# Patient Record
Sex: Female | Born: 1990 | Race: White | Hispanic: No | Marital: Single | Smoking: Current every day smoker
Health system: Southern US, Community
[De-identification: ages and names within clinical notes are randomized; demographics above are authoritative.]

## PROBLEM LIST (undated history)

## (undated) DIAGNOSIS — Z8619 Personal history of other infectious and parasitic diseases: Secondary | ICD-10-CM

## (undated) DIAGNOSIS — O24419 Gestational diabetes mellitus in pregnancy, unspecified control: Secondary | ICD-10-CM

## (undated) DIAGNOSIS — L732 Hidradenitis suppurativa: Secondary | ICD-10-CM

## (undated) HISTORY — DX: Hidradenitis suppurativa: L73.2

## (undated) HISTORY — DX: Personal history of other infectious and parasitic diseases: Z86.19

## (undated) HISTORY — PX: TUBAL LIGATION: SHX77

## (undated) HISTORY — DX: Gestational diabetes mellitus in pregnancy, unspecified control: O24.419

---

## 2009-11-09 ENCOUNTER — Other Ambulatory Visit: Admission: RE | Admit: 2009-11-09 | Discharge: 2009-11-09 | Payer: Self-pay | Admitting: Obstetrics and Gynecology

## 2010-03-21 ENCOUNTER — Encounter
Admission: RE | Admit: 2010-03-21 | Discharge: 2010-05-01 | Payer: Self-pay | Source: Home / Self Care | Attending: Obstetrics & Gynecology | Admitting: Obstetrics & Gynecology

## 2010-05-25 ENCOUNTER — Inpatient Hospital Stay (HOSPITAL_COMMUNITY)
Admission: AD | Admit: 2010-05-25 | Discharge: 2010-05-27 | DRG: 775 | Disposition: A | Discharge status: Home Health | Payer: Medicaid Other | Source: Ambulatory Visit | Attending: Obstetrics & Gynecology | Admitting: Obstetrics & Gynecology

## 2010-05-25 DIAGNOSIS — O99814 Abnormal glucose complicating childbirth: Secondary | ICD-10-CM

## 2010-05-25 LAB — RPR: RPR Ser Ql: NONREACTIVE

## 2010-05-25 LAB — CBC
MCH: 26.5 pg (ref 26.0–34.0)
MCV: 82 fL (ref 78.0–100.0)
Platelets: 305 10*3/uL (ref 150–400)
RBC: 4.61 MIL/uL (ref 3.87–5.11)
RDW: 14.7 % (ref 11.5–15.5)
WBC: 16.1 10*3/uL — ABNORMAL HIGH (ref 4.0–10.5)

## 2010-05-26 LAB — CBC
HCT: 31.4 % — ABNORMAL LOW (ref 36.0–46.0)
Hemoglobin: 9.7 g/dL — ABNORMAL LOW (ref 12.0–15.0)
MCV: 83.3 fL (ref 78.0–100.0)
WBC: 22.7 10*3/uL — ABNORMAL HIGH (ref 4.0–10.5)

## 2010-05-26 LAB — GLUCOSE, CAPILLARY
Glucose-Capillary: 77 mg/dL (ref 70–99)
Glucose-Capillary: 160 mg/dL — ABNORMAL HIGH (ref 70–99)
Glucose-Capillary: 151 mg/dL — ABNORMAL HIGH (ref 70–99)

## 2010-05-27 LAB — GLUCOSE, CAPILLARY
Glucose-Capillary: 80 mg/dL (ref 70–99)
Glucose-Capillary: 105 mg/dL — ABNORMAL HIGH (ref 70–99)

## 2010-06-08 NOTE — H&P (Signed)
  [name], [name] NO.:  [id number]  MEDICAL RECORD NO.:  [id number]           PATIENT TYPE:  LOCATION:                                 FACILITY:  PHYSICIAN:  Tilda Burrow, M.D. DATE OF BIRTH:  [birth year]  DATE OF ADMISSION: DATE OF DISCHARGE:                             HISTORY & PHYSICAL   DATE OF ADMISSION:  May 26, 2010, at 7:30 p.m. to [facility].  ADMISSION DIAGNOSIS:  Pregnancy, 39 weeks' gestation; class A2 diabetes mellitus on glyburide, metformin, and Lantus.  HISTORY OF PRESENT ILLNESS:  This 20 year old primigravida with unsure LMP and ultrasound-assigned EDC of June 03, 2010, based on 8-week 4-day ultrasound on October 26, 2009, with 19-week ultrasound on January 05, 2011, suggesting May 30, 2010, and 28-week ultrasound on March 07, 2010, suggesting [facility] of May 20, 2010.  This progression of the EDC based on later ultrasound suggests larger than average infant, and estimated fetal weight by ultrasound on May 11, 2010, was 3422 g (7 pounds 9 ounces) with an AFI of 22.54.  The abdominal circumference on the baby was in the 98th percentile at the time of that ultrasound.  The patient is admitted at 39 weeks for induction of labor.  Non-stress tests are being performed  biweekly at [facility] and will be continued until day of induction.  Prenatal course has been notable for abnormal 2-hour glucose tolerance test at 28 weeks' gestation with progression from diet control; with hemoglobin A1c of 6.2 on March 07, 2010, to addition of glyburide, then metformin and Lantus with progression of Lantus from 38 __________ to most recently the doses are as follows: 1. Glyburide 5 mg b.i.d. 2. Metformin 500 mg b.i.d. 3. Lantus 40 mg at bedtime.  Blood sugars over the last week from February 10 to May 18, 2010, have been excellent.  PAST MEDICAL HISTORY:  Positive for hidradenitis of mild difficulty during the  pregnancy.  SURGICAL HISTORY:  Negative.  ALLERGIES:  None.  SOCIAL HISTORY:  She is a smoker, but has reduced frequency.  Is a nondrinker and denies recreational drugs.  Prenatal labs include blood type A+, rubella immunity present, hemoglobin 12, hematocrit 37; hepatitis, HIV, RPR, GC and Chlamydia all negative.  MSAFP was declined.  Initial chlamydia test was positive and repeated with [facility] on December 07, 2009.  Hemoglobin A1c was 6.2 on December 7.  PHYSICAL EXAMINATION:  GENERAL:  A large-framed Caucasian female. VITAL SIGNS:  Weight 251.2, blood pressure 145/80. HEENT:  With pupils equal, round, and reactive. NECK:  Supple. CARDIOVASCULAR:  Unremarkable. ABDOMEN:  Fundal height 44-45 cm, unchanged since 3 visits.  Fetus is vertex presentation.  Non-stress is reactive.  PLAN:  Continue biweekly NSTs with induction of labor on May 26, 2010, 7:30 p.m.     Tilda Burrow, M.D.     JVF/MEDQ  D:  05/18/2010  T:  05/19/2010  Job:  [id number]  cc:   [facility]  Electronically Signed by Christin Bach M.D. on 06/08/2010 10:41:46 AM

## 2010-12-16 ENCOUNTER — Emergency Department (HOSPITAL_COMMUNITY)
Admission: EM | Admit: 2010-12-16 | Discharge: 2010-12-16 | Disposition: A | Discharge status: Home / Self Care | Payer: Medicaid Other | Attending: Emergency Medicine | Admitting: Emergency Medicine

## 2010-12-16 ENCOUNTER — Encounter: Payer: Self-pay | Admitting: *Deleted

## 2010-12-16 DIAGNOSIS — H60399 Other infective otitis externa, unspecified ear: Secondary | ICD-10-CM | POA: Insufficient documentation

## 2010-12-16 DIAGNOSIS — F17200 Nicotine dependence, unspecified, uncomplicated: Secondary | ICD-10-CM | POA: Insufficient documentation

## 2010-12-16 DIAGNOSIS — E119 Type 2 diabetes mellitus without complications: Secondary | ICD-10-CM | POA: Insufficient documentation

## 2010-12-16 MED ORDER — NEOMYCIN-POLYMYXIN-HC 3.5-10000-1 OT SOLN
3.0000 [drp] | Freq: Once | OTIC | Status: AC
  Administered 2010-12-16: 3 [drp] via OTIC
  Filled 2010-12-16: qty 10

## 2010-12-16 MED ORDER — AMOXICILLIN 250 MG PO CAPS
500.0000 mg | ORAL_CAPSULE | Freq: Once | ORAL | Status: AC
  Administered 2010-12-16: 500 mg via ORAL
  Filled 2010-12-16: qty 2

## 2010-12-16 MED ORDER — ANTIPYRINE-BENZOCAINE 5.4-1.4 % OT SOLN
3.0000 [drp] | Freq: Once | OTIC | Status: AC
  Administered 2010-12-16: 3 [drp] via OTIC
  Filled 2010-12-16: qty 10

## 2010-12-16 NOTE — ED Notes (Signed)
Patient c/o left earache and right earache, left > right, hx of yeast infections in ear canal per pt.

## 2010-12-16 NOTE — ED Provider Notes (Signed)
History     CSN: [id number] Arrival date & time: 12/16/2010  8:41 PM   Chief Complaint  Patient presents with  . Otalgia     (Include location/radiation/quality/duration/timing/severity/associated sxs/prior treatment) HPI Comments: Hearing loss in B ears and mild discomfort; L>R.   States she was recently treated for a yeast infection in her L ear.  She has not been on abx recently.  She has no evidence of thrush and has no vaginal d/c.  Her immune system is not compromised.  Patient is a 20 y.o. female presenting with ear pain. The history is provided by the patient. No language interpreter was used.  Otalgia This is a new problem. The current episode started 2 days ago. There is pain in both ears. The problem occurs constantly. The problem has not changed since onset.There has been no fever. The pain is mild. Associated symptoms include hearing loss. Pertinent negatives include no ear discharge and no sore throat.     Past Medical History  Diagnosis Date  . Diabetes mellitus      History reviewed. No pertinent past surgical history.  History reviewed. No pertinent family history.  History  Substance Use Topics  . Smoking status: Current Everyday Smoker -- 1.0 packs/day    Types: Cigarettes  . Smokeless tobacco: Not on file  . Alcohol Use: No    OB History    Grav Para Term Preterm Abortions TAB SAB Ect Mult Living                  Review of Systems  Constitutional: Negative for fever.  HENT: Positive for hearing loss and ear pain. Negative for sore throat and ear discharge.   All other systems reviewed and are negative.    Allergies  Review of patient's allergies indicates no known allergies.  Home Medications  No current outpatient prescriptions on file.  Physical Exam    BP 129/64  Temp(Src) 98.8 F (37.1 C) (Oral)  Resp 16  Ht 5\' 4"  (1.626 m)  Wt 250 lb (113.399 kg)  BMI 42.91 kg/m2  SpO2 100%  LMP 11/25/2010  Physical Exam  Nursing note and  vitals reviewed. Constitutional: She is oriented to person, place, and time. Vital signs are normal. She appears well-developed and well-nourished. No distress.  HENT:  Head: Normocephalic and atraumatic.  Nose: Nose normal.  Mouth/Throat: No oropharyngeal exudate.       Neither TM is visible.  EAC's are full of whitish colored cerumen.  She denies inserting any liquids in her ears.  Eyes: Conjunctivae and EOM are normal. Pupils are equal, round, and reactive to light. Right eye exhibits no discharge. Left eye exhibits no discharge. No scleral icterus.  Neck: Normal range of motion. Neck supple. No JVD present. No tracheal deviation present. No thyromegaly present.  Cardiovascular: Normal rate, regular rhythm, normal heart sounds, intact distal pulses and normal pulses.  Exam reveals no gallop and no friction rub.   No murmur heard. Pulmonary/Chest: Effort normal and breath sounds normal. No stridor. No respiratory distress. She has no wheezes. She has no rales. She exhibits no tenderness.  Abdominal: Soft. Normal appearance and bowel sounds are normal. She exhibits no distension and no mass. There is no tenderness. There is no rebound and no guarding.  Musculoskeletal: Normal range of motion. She exhibits no edema and no tenderness.  Lymphadenopathy:    She has no cervical adenopathy.  Neurological: She is alert and oriented to person, place, and time. She has normal reflexes.  No cranial nerve deficit. Coordination normal. GCS eye subscore is 4. GCS verbal subscore is 5. GCS motor subscore is 6.  Skin: Skin is warm and dry. No rash noted. She is not diaphoretic.  Psychiatric: She has a normal mood and affect. Her speech is normal and behavior is normal. Judgment and thought content normal. Cognition and memory are normal.    ED Course  Procedures  Results for orders placed during the hospital encounter of 05/25/10  CBC      Component Value Range   WBC 16.1 (*) 4.0 - 10.5 (K/uL)   RBC  4.61  3.87 - 5.11 (MIL/uL)   Hemoglobin 12.2  12.0 - 15.0 (g/dL)   HCT 16.1  09.6 - 04.5 (%)   MCV 82.0  78.0 - 100.0 (fL)   MCH 26.5  26.0 - 34.0 (pg)   MCHC 32.3  30.0 - 36.0 (g/dL)   RDW 40.9  81.1 - 91.4 (%)   Platelets 305  150 - 400 (K/uL)  RPR      Component Value Range   RPR NON REACTIVE  NON REACTIVE   GLUCOSE, CAPILLARY      Component Value Range   Glucose-Capillary 81  70 - 99 (mg/dL)  GLUCOSE, CAPILLARY      Component Value Range   Glucose-Capillary 142 (*) 70 - 99 (mg/dL)  GLUCOSE, CAPILLARY      Component Value Range   Glucose-Capillary 78  70 - 99 (mg/dL)  GLUCOSE, CAPILLARY      Component Value Range   Glucose-Capillary 50 (*) 70 - 99 (mg/dL)  GLUCOSE, CAPILLARY      Component Value Range   Glucose-Capillary 61 (*) 70 - 99 (mg/dL)  GLUCOSE, CAPILLARY      Component Value Range   Glucose-Capillary 77  70 - 99 (mg/dL)  CBC      Component Value Range   WBC 22.7 (*) 4.0 - 10.5 (K/uL)   RBC 3.77 (*) 3.87 - 5.11 (MIL/uL)   Hemoglobin 9.7 DELTA CHECK NOTED REPEATED TO VERIFY (*) 12.0 - 15.0 (g/dL)   HCT 78.2 (*) 95.6 - 46.0 (%)   MCV 83.3  78.0 - 100.0 (fL)   MCH 25.7 (*) 26.0 - 34.0 (pg)   MCHC 30.9  30.0 - 36.0 (g/dL)   RDW 21.3  08.6 - 57.8 (%)   Platelets 259  150 - 400 (K/uL)  GLUCOSE, CAPILLARY      Component Value Range   Glucose-Capillary 91  70 - 99 (mg/dL)   Comment 1 Notify RN    GLUCOSE, CAPILLARY      Component Value Range   Glucose-Capillary 160 (*) 70 - 99 (mg/dL)   Comment 1 Notify RN    GLUCOSE, CAPILLARY      Component Value Range   Glucose-Capillary 151 (*) 70 - 99 (mg/dL)  GLUCOSE, CAPILLARY      Component Value Range   Glucose-Capillary 80  70 - 99 (mg/dL)  GLUCOSE, CAPILLARY      Component Value Range   Glucose-Capillary 105 (*) 70 - 99 (mg/dL)   Comment 1 Notify RN     No results found.   No diagnosis found.   MDM The ears were re-evaluated after the RN irrigated with saline.  L TM was completely visualized but the L TM  was still not visible.  Will treat both topically and systemically.  A culture and sensitivity was obtained and sent to the lab.       Worthy Rancher, PA 12/16/10  2223 

## 2010-12-16 NOTE — ED Notes (Signed)
Bilateral ears irrigated with NS

## 2010-12-16 NOTE — ED Provider Notes (Signed)
Medical screening examination/treatment/procedure(s) were performed by non-physician practitioner and as supervising physician I was immediately available for consultation/collaboration.   Dayton Bailiff, MD 12/16/10 2223

## 2010-12-16 NOTE — ED Notes (Signed)
Pt a/ox4. Resp even and unlabored. NAd at this time. D/C instructions reviewed with pt. Pt verbalized understanding. Pt ambulated with steady gate to POV.

## 2010-12-20 LAB — CULTURE, ROUTINE-ABSCESS

## 2010-12-21 NOTE — ED Notes (Signed)
+   ABSCESS CULTURE PATIENT TREATED WITH AMOXACILLIN SENSITIVE TO SAME

## 2012-06-08 ENCOUNTER — Other Ambulatory Visit (HOSPITAL_COMMUNITY)
Admission: RE | Admit: 2012-06-08 | Discharge: 2012-06-08 | Disposition: A | Discharge status: Home / Self Care | Payer: Managed Care, Other (non HMO) | Source: Ambulatory Visit | Attending: Obstetrics and Gynecology | Admitting: Obstetrics and Gynecology

## 2012-06-08 DIAGNOSIS — Z113 Encounter for screening for infections with a predominantly sexual mode of transmission: Secondary | ICD-10-CM | POA: Insufficient documentation

## 2012-06-08 DIAGNOSIS — Z01419 Encounter for gynecological examination (general) (routine) without abnormal findings: Secondary | ICD-10-CM | POA: Insufficient documentation

## 2012-06-17 ENCOUNTER — Telehealth: Payer: Self-pay | Admitting: Adult Health

## 2012-06-18 NOTE — Telephone Encounter (Signed)
Called pt.  At home and no answer, called work and she had left, will try again in am.

## 2012-06-22 MED ORDER — ACCU-CHEK FASTCLIX LANCETS MISC: 1.0000 | Freq: Four times a day (QID) | Status: DC

## 2012-06-22 MED ORDER — GLUCOSE BLOOD VI STRP: ORAL_STRIP | Status: DC

## 2012-06-22 NOTE — Telephone Encounter (Signed)
Called pt., she needs refills on strips and lancets for accuchek aviva.Refilled at [facility].

## 2012-06-22 NOTE — Addendum Note (Signed)
Addended by: Cyril Mourning A on: 06/22/2012 11:17 AM   Modules accepted: Orders

## 2012-06-25 ENCOUNTER — Encounter (HOSPITAL_COMMUNITY): Payer: Self-pay

## 2012-06-25 ENCOUNTER — Emergency Department (HOSPITAL_COMMUNITY)
Admission: EM | Admit: 2012-06-25 | Discharge: 2012-06-25 | Disposition: A | Discharge status: Home / Self Care | Payer: Managed Care, Other (non HMO) | Attending: Emergency Medicine | Admitting: Emergency Medicine

## 2012-06-25 DIAGNOSIS — K921 Melena: Secondary | ICD-10-CM | POA: Insufficient documentation

## 2012-06-25 DIAGNOSIS — K625 Hemorrhage of anus and rectum: Secondary | ICD-10-CM | POA: Insufficient documentation

## 2012-06-25 DIAGNOSIS — E119 Type 2 diabetes mellitus without complications: Secondary | ICD-10-CM | POA: Insufficient documentation

## 2012-06-25 DIAGNOSIS — F17200 Nicotine dependence, unspecified, uncomplicated: Secondary | ICD-10-CM | POA: Insufficient documentation

## 2012-06-25 DIAGNOSIS — R197 Diarrhea, unspecified: Secondary | ICD-10-CM | POA: Insufficient documentation

## 2012-06-25 LAB — CBC
MCH: 30 pg (ref 26.0–34.0)
Platelets: 312 10*3/uL (ref 150–400)
RBC: 4.23 MIL/uL (ref 3.87–5.11)
WBC: 11.4 10*3/uL — ABNORMAL HIGH (ref 4.0–10.5)

## 2012-06-25 NOTE — ED Notes (Signed)
Patient with no complaints at this time. Respirations even and unlabored. Skin warm/dry. Discharge instructions reviewed with patient at this time. Patient given opportunity to voice concerns/ask questions. Patient discharged at this time and left Emergency Department with steady gait.   

## 2012-06-25 NOTE — ED Provider Notes (Signed)
Medical screening examination/treatment/procedure(s) were conducted as a shared visit with non-physician practitioner(s) and myself.  I personally evaluated the patient during the encounter.  No acute abdomen.  VSS.  Has primary care f/u  Donnetta Hutching, MD 06/25/12 2204

## 2012-06-25 NOTE — ED Provider Notes (Signed)
History     CSN: [id number]  Arrival date & time 06/25/12  1641   First MD Initiated Contact with Patient 06/25/12 1704      Chief Complaint  Patient presents with  . Rectal Bleeding    (Consider location/radiation/quality/duration/timing/severity/associated sxs/prior Treatment)  Patient is a 22 y.o. female presenting with hematochezia. The history is provided by the patient.  Rectal Bleeding  The current episode started 2 days ago. The onset was sudden. The problem occurs occasionally. The patient is experiencing no pain. The stool is described as liquid and streaked with blood. There was no prior successful therapy. There was no prior unsuccessful therapy. Associated symptoms include diarrhea. Pertinent negatives include no anorexia, no fever, no abdominal pain (cramp with diarrhea), no hematemesis, no hemorrhoids, no nausea, no rectal pain, no vomiting, no vaginal bleeding, no chest pain, no headaches, no coughing and no rash. There were no sick contacts.   Patient reports having watery stools that started 2 days ago. She had 3 stools 2 days ago, none yesterday and then 4 today. Today she noted streaks of blood in the stool. She has not changed her diet. She denies nausea, vomiting or abdominal pain.   Past Medical History  Diagnosis Date  . Diabetes mellitus     History reviewed. No pertinent past surgical history.  No family history on file.  History  Substance Use Topics  . Smoking status: Current Every Day Smoker -- 1.00 packs/day    Types: Cigarettes  . Smokeless tobacco: Not on file  . Alcohol Use: No    OB History   Grav Para Term Preterm Abortions TAB SAB Ect Mult Living                  Review of Systems  Constitutional: Negative for fever, chills, appetite change and fatigue.  HENT: Negative for ear pain, congestion, sore throat, facial swelling, neck pain, neck stiffness, dental problem and sinus pressure.   Eyes: Negative for photophobia, pain and  discharge.  Respiratory: Negative for cough, chest tightness and wheezing.   Cardiovascular: Negative for chest pain and palpitations.  Gastrointestinal: Positive for diarrhea and hematochezia. Negative for nausea, vomiting, abdominal pain (cramp with diarrhea), constipation, abdominal distention, rectal pain, anorexia, hematemesis and hemorrhoids.  Genitourinary: Negative for dysuria, frequency, flank pain, vaginal bleeding, difficulty urinating, menstrual problem and pelvic pain.  Musculoskeletal: Negative for myalgias and back pain.  Skin: Negative for color change and rash.  Neurological: Negative for dizziness, weakness, light-headedness, numbness and headaches.  Psychiatric/Behavioral: Negative for confusion and agitation. The patient is not nervous/anxious.     Allergies  Review of patient's allergies indicates no known allergies.  Home Medications   Current Outpatient Rx  Name  Route  Sig  Dispense  Refill  . metFORMIN (GLUCOPHAGE) 500 MG tablet   Oral   Take 500 mg by mouth daily.         . norethindrone-ethinyl estradiol-iron (LOESTRIN FE 1.5/30) 1.5-30 MG-MCG tablet   Oral   Take 1 tablet by mouth daily.           BP 151/94  Pulse 116  Temp(Src) 98.4 F (36.9 C) (Oral)  Resp 18  Ht 5\' 4"  (1.626 m)  Wt 225 lb (102.059 kg)  BMI 38.6 kg/m2  SpO2 100%  LMP 06/11/2012  Physical Exam  Nursing note and vitals reviewed. Constitutional: She is oriented to person, place, and time. She appears well-developed and well-nourished.  HENT:  Head: Normocephalic and atraumatic.  Eyes: EOM  are normal. Pupils are equal, round, and reactive to light.  Neck: Neck supple.  Cardiovascular: Tachycardia present.   Pulmonary/Chest: Effort normal and breath sounds normal.  Abdominal: Soft. Normal appearance. Bowel sounds are increased. There is no tenderness. There is no CVA tenderness.  Genitourinary: Rectal exam shows no external hemorrhoid, no mass and no tenderness.  Yellow  stool with streaks of blood.  Musculoskeletal: Normal range of motion. She exhibits no edema.  Neurological: She is alert and oriented to person, place, and time. No cranial nerve deficit.  Skin: Skin is warm and dry.  Psychiatric: She has a normal mood and affect. Her behavior is normal. Judgment and thought content normal.   Results for orders placed during the hospital encounter of 06/25/12 (from the past 24 hour(s))  CBC     Status: Abnormal   Collection Time    06/25/12  5:31 PM      Result Value Range   WBC 11.4 (*) 4.0 - 10.5 K/uL   RBC 4.23  3.87 - 5.11 MIL/uL   Hemoglobin 12.7  12.0 - 15.0 g/dL   HCT 16.1  09.6 - 04.5 %   MCV 89.1  78.0 - 100.0 fL   MCH 30.0  26.0 - 34.0 pg   MCHC 33.7  30.0 - 36.0 g/dL   RDW 40.9  81.1 - 91.4 %   Platelets 312  150 - 400 K/uL    Assessment: 22 y.o. female with rectal bleeding   Diarrhea  Plan:  Clear liquids x 12 hours then advance to [facility].R.A.[facility]. Until diarrhea subsides    Follow up with PCP if symptoms persist ED Course  Procedures (including critical care time)  MDM  I have reviewed this patient's vital signs, nurses notes, appropriate labs and discussed findings and plan of care with the patiet.  Here CBC is normal and there is only scant bright red streaks of blood in the watery stool on rectal exam. The patient will stay on clear liquids tonight and then advance to [employer]. She is stable for discharge.  If her symptoms worsen she will return to the ED.        [place], [place] 06/25/12 2110

## 2012-06-25 NOTE — ED Notes (Signed)
Pt reports blood in stool multiple times since Tuesday, denies any dizziness or lightheaded. Occurred x3 on Tuesday, none on Wednesday and 4 x today. Diarrhea stools and pink in toilet bowl.

## 2012-09-25 ENCOUNTER — Encounter: Payer: Self-pay | Admitting: *Deleted

## 2012-09-28 ENCOUNTER — Other Ambulatory Visit: Payer: Self-pay

## 2013-07-22 ENCOUNTER — Telehealth (HOSPITAL_COMMUNITY): Payer: Self-pay | Admitting: Dietician

## 2013-07-22 NOTE — Telephone Encounter (Signed)
Received referral via fax from [place] for dx: diabetes.

## 2013-08-11 NOTE — Telephone Encounter (Signed)
Pt has not responded to attempts to contact to schedule appointment. Referral filed.  

## 2013-10-07 ENCOUNTER — Other Ambulatory Visit (HOSPITAL_COMMUNITY): Payer: Self-pay

## 2013-11-18 ENCOUNTER — Ambulatory Visit (HOSPITAL_COMMUNITY): Payer: Managed Care, Other (non HMO)

## 2014-01-27 ENCOUNTER — Ambulatory Visit (HOSPITAL_COMMUNITY): Payer: Managed Care, Other (non HMO)

## 2014-01-31 ENCOUNTER — Encounter: Payer: Self-pay | Admitting: *Deleted

## 2014-02-03 ENCOUNTER — Ambulatory Visit (HOSPITAL_COMMUNITY)
Admission: RE | Admit: 2014-02-03 | Discharge: 2014-02-03 | Disposition: A | Discharge status: Home / Self Care | Payer: Managed Care, Other (non HMO) | Source: Ambulatory Visit | Attending: Unknown Physician Specialty | Admitting: Unknown Physician Specialty

## 2014-02-03 NOTE — Patient Instructions (Signed)
Plan:  Aim for 2-3 Carb Choices per meal (30-45 grams)   Aim for 1-2 Carbs per snack if hungry  Include protein in moderation with your meals and snacks Consider reading food labels for Total Carbohydrate of foods Continue checking BG before breakfast and 1 - 2 hours after each meal as directed by MD

## 2014-02-04 ENCOUNTER — Other Ambulatory Visit (HOSPITAL_COMMUNITY): Payer: Self-pay | Admitting: Unknown Physician Specialty

## 2014-02-04 DIAGNOSIS — O24912 Unspecified diabetes mellitus in pregnancy, second trimester: Secondary | ICD-10-CM

## 2014-02-04 DIAGNOSIS — Z3689 Encounter for other specified antenatal screening: Secondary | ICD-10-CM

## 2014-02-09 ENCOUNTER — Other Ambulatory Visit (HOSPITAL_COMMUNITY): Payer: Self-pay

## 2014-02-09 ENCOUNTER — Ambulatory Visit (HOSPITAL_COMMUNITY)
Admission: RE | Admit: 2014-02-09 | Discharge: 2014-02-09 | Disposition: A | Discharge status: Home / Self Care | Payer: Managed Care, Other (non HMO) | Source: Ambulatory Visit | Attending: Unknown Physician Specialty | Admitting: Unknown Physician Specialty

## 2014-02-09 ENCOUNTER — Encounter (HOSPITAL_COMMUNITY): Payer: Self-pay

## 2014-02-09 ENCOUNTER — Other Ambulatory Visit (HOSPITAL_COMMUNITY): Payer: Self-pay | Admitting: Unknown Physician Specialty

## 2014-02-09 DIAGNOSIS — F17210 Nicotine dependence, cigarettes, uncomplicated: Secondary | ICD-10-CM | POA: Insufficient documentation

## 2014-02-09 DIAGNOSIS — D259 Leiomyoma of uterus, unspecified: Secondary | ICD-10-CM | POA: Insufficient documentation

## 2014-02-09 DIAGNOSIS — O3412 Maternal care for benign tumor of corpus uteri, second trimester: Secondary | ICD-10-CM | POA: Diagnosis not present

## 2014-02-09 DIAGNOSIS — O24912 Unspecified diabetes mellitus in pregnancy, second trimester: Secondary | ICD-10-CM

## 2014-02-09 DIAGNOSIS — E119 Type 2 diabetes mellitus without complications: Secondary | ICD-10-CM | POA: Diagnosis not present

## 2014-02-09 DIAGNOSIS — O24112 Pre-existing diabetes mellitus, type 2, in pregnancy, second trimester: Secondary | ICD-10-CM | POA: Insufficient documentation

## 2014-02-09 DIAGNOSIS — Z3A24 24 weeks gestation of pregnancy: Secondary | ICD-10-CM | POA: Diagnosis not present

## 2014-02-09 DIAGNOSIS — O99332 Smoking (tobacco) complicating pregnancy, second trimester: Secondary | ICD-10-CM | POA: Diagnosis not present

## 2014-02-09 DIAGNOSIS — O24919 Unspecified diabetes mellitus in pregnancy, unspecified trimester: Secondary | ICD-10-CM | POA: Insufficient documentation

## 2014-02-09 DIAGNOSIS — Z3689 Encounter for other specified antenatal screening: Secondary | ICD-10-CM | POA: Insufficient documentation

## 2014-03-01 ENCOUNTER — Other Ambulatory Visit (HOSPITAL_COMMUNITY): Payer: Self-pay

## 2014-03-09 ENCOUNTER — Ambulatory Visit (HOSPITAL_COMMUNITY)
Admission: RE | Admit: 2014-03-09 | Discharge: 2014-03-09 | Disposition: A | Discharge status: Home / Self Care | Payer: Managed Care, Other (non HMO) | Source: Ambulatory Visit | Attending: Unknown Physician Specialty | Admitting: Unknown Physician Specialty

## 2014-03-09 ENCOUNTER — Other Ambulatory Visit (HOSPITAL_COMMUNITY): Payer: Self-pay | Admitting: Unknown Physician Specialty

## 2014-03-09 DIAGNOSIS — O99213 Obesity complicating pregnancy, third trimester: Secondary | ICD-10-CM | POA: Insufficient documentation

## 2014-03-09 DIAGNOSIS — Z794 Long term (current) use of insulin: Secondary | ICD-10-CM | POA: Diagnosis not present

## 2014-03-09 DIAGNOSIS — O3413 Maternal care for benign tumor of corpus uteri, third trimester: Secondary | ICD-10-CM | POA: Insufficient documentation

## 2014-03-09 DIAGNOSIS — D259 Leiomyoma of uterus, unspecified: Secondary | ICD-10-CM | POA: Diagnosis not present

## 2014-03-09 DIAGNOSIS — Z3A28 28 weeks gestation of pregnancy: Secondary | ICD-10-CM | POA: Diagnosis not present

## 2014-03-09 DIAGNOSIS — O3660X0 Maternal care for excessive fetal growth, unspecified trimester, not applicable or unspecified: Secondary | ICD-10-CM | POA: Insufficient documentation

## 2014-03-09 DIAGNOSIS — O24113 Pre-existing diabetes mellitus, type 2, in pregnancy, third trimester: Secondary | ICD-10-CM | POA: Diagnosis present

## 2014-03-09 DIAGNOSIS — O24912 Unspecified diabetes mellitus in pregnancy, second trimester: Secondary | ICD-10-CM

## 2014-03-09 DIAGNOSIS — O99333 Smoking (tobacco) complicating pregnancy, third trimester: Secondary | ICD-10-CM | POA: Diagnosis not present

## 2014-03-09 DIAGNOSIS — E119 Type 2 diabetes mellitus without complications: Secondary | ICD-10-CM | POA: Insufficient documentation

## 2014-03-09 DIAGNOSIS — F17210 Nicotine dependence, cigarettes, uncomplicated: Secondary | ICD-10-CM | POA: Diagnosis not present

## 2014-04-06 ENCOUNTER — Encounter (HOSPITAL_COMMUNITY): Payer: Self-pay

## 2014-04-06 ENCOUNTER — Ambulatory Visit (HOSPITAL_COMMUNITY)
Admission: RE | Admit: 2014-04-06 | Discharge: 2014-04-06 | Disposition: A | Discharge status: Home / Self Care | Payer: Managed Care, Other (non HMO) | Source: Ambulatory Visit | Attending: Unknown Physician Specialty | Admitting: Unknown Physician Specialty

## 2014-04-06 DIAGNOSIS — O133 Gestational [pregnancy-induced] hypertension without significant proteinuria, third trimester: Secondary | ICD-10-CM | POA: Insufficient documentation

## 2014-04-06 DIAGNOSIS — O99213 Obesity complicating pregnancy, third trimester: Secondary | ICD-10-CM | POA: Insufficient documentation

## 2014-04-06 DIAGNOSIS — O99333 Smoking (tobacco) complicating pregnancy, third trimester: Secondary | ICD-10-CM | POA: Insufficient documentation

## 2014-04-06 DIAGNOSIS — F17210 Nicotine dependence, cigarettes, uncomplicated: Secondary | ICD-10-CM | POA: Insufficient documentation

## 2014-04-06 DIAGNOSIS — E119 Type 2 diabetes mellitus without complications: Secondary | ICD-10-CM | POA: Insufficient documentation

## 2014-04-06 DIAGNOSIS — Z3A32 32 weeks gestation of pregnancy: Secondary | ICD-10-CM | POA: Diagnosis not present

## 2014-04-06 DIAGNOSIS — O24113 Pre-existing diabetes mellitus, type 2, in pregnancy, third trimester: Secondary | ICD-10-CM | POA: Insufficient documentation

## 2014-04-06 DIAGNOSIS — O24912 Unspecified diabetes mellitus in pregnancy, second trimester: Secondary | ICD-10-CM

## 2014-04-06 DIAGNOSIS — O3663X1 Maternal care for excessive fetal growth, third trimester, fetus 1: Secondary | ICD-10-CM

## 2014-05-05 ENCOUNTER — Ambulatory Visit (HOSPITAL_COMMUNITY)
Admission: RE | Admit: 2014-05-05 | Discharge: 2014-05-05 | Disposition: A | Discharge status: Home / Self Care | Payer: Managed Care, Other (non HMO) | Source: Ambulatory Visit | Attending: Unknown Physician Specialty | Admitting: Unknown Physician Specialty

## 2014-05-05 DIAGNOSIS — O99333 Smoking (tobacco) complicating pregnancy, third trimester: Secondary | ICD-10-CM | POA: Insufficient documentation

## 2014-05-05 DIAGNOSIS — Z3A36 36 weeks gestation of pregnancy: Secondary | ICD-10-CM | POA: Diagnosis not present

## 2014-05-05 DIAGNOSIS — O99213 Obesity complicating pregnancy, third trimester: Secondary | ICD-10-CM | POA: Insufficient documentation

## 2014-05-05 DIAGNOSIS — O24113 Pre-existing diabetes mellitus, type 2, in pregnancy, third trimester: Secondary | ICD-10-CM

## 2014-05-05 DIAGNOSIS — O24312 Unspecified pre-existing diabetes mellitus in pregnancy, second trimester: Secondary | ICD-10-CM | POA: Insufficient documentation

## 2014-05-05 DIAGNOSIS — O3663X1 Maternal care for excessive fetal growth, third trimester, fetus 1: Secondary | ICD-10-CM

## 2014-05-05 DIAGNOSIS — F17210 Nicotine dependence, cigarettes, uncomplicated: Secondary | ICD-10-CM | POA: Diagnosis not present

## 2014-05-05 NOTE — ED Notes (Signed)
There has been a policy change in [facility] regarding visitors and children under 12.  Only 2 visitors allowed and no children under 12. Patient did come with her young daughter and the policy was explained to patient.  She did verbalize understanding.

## 2014-12-15 ENCOUNTER — Encounter (HOSPITAL_COMMUNITY): Payer: Self-pay | Admitting: *Deleted

## 2015-05-13 IMAGING — US US OB DETAIL+14 WK
1 series · 12 of 28 positions shown · non-contrast
Comparison: none

[Series 1: us ob detail+14 wk · 0.26mm/px · 12 of 78 slices shown]
[im 3/78]
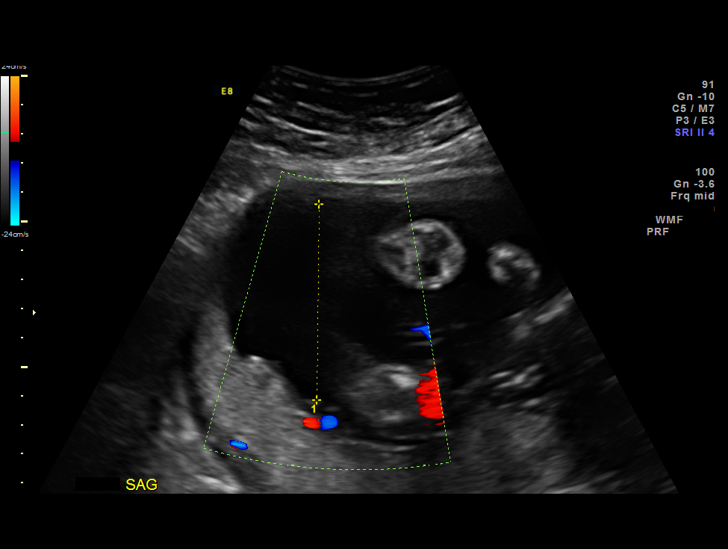
[im 9/78]
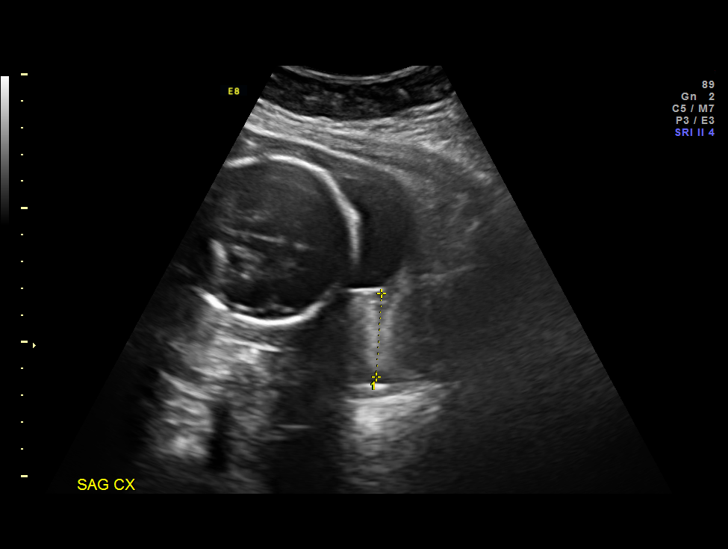
[im 15/78]
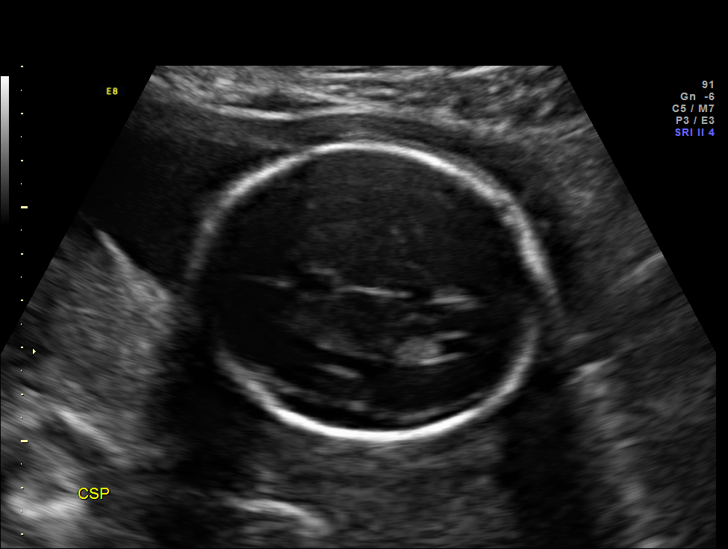
[im 23/78]
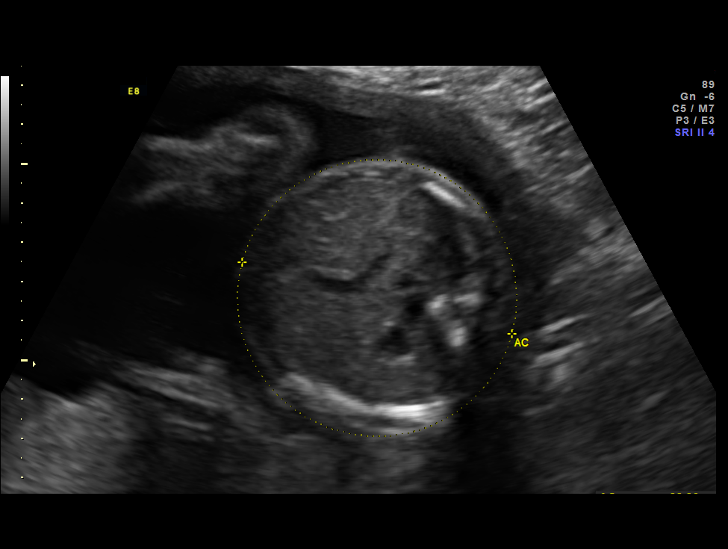
[im 29/78]
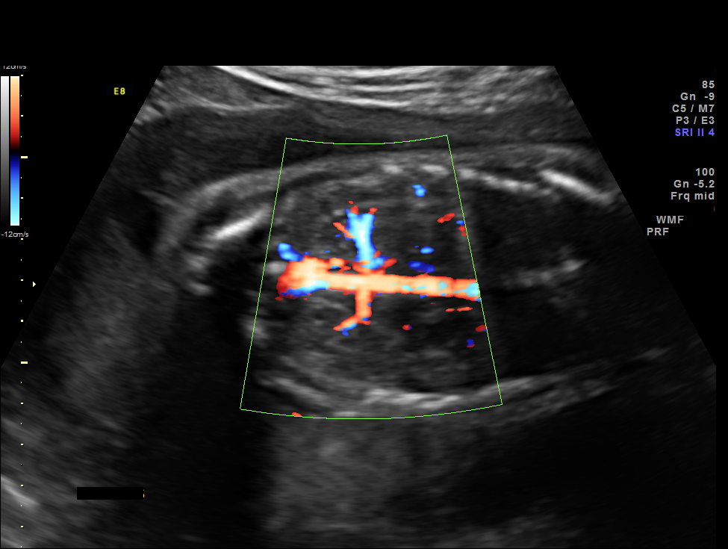
[im 35/78]
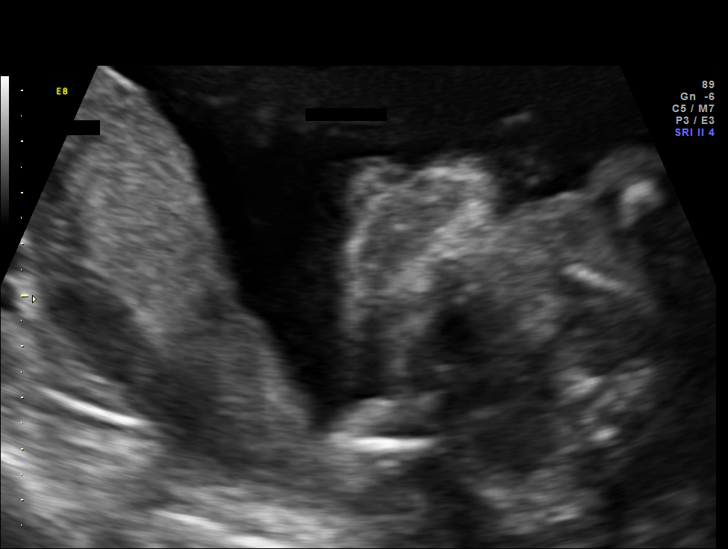
[im 43/78]
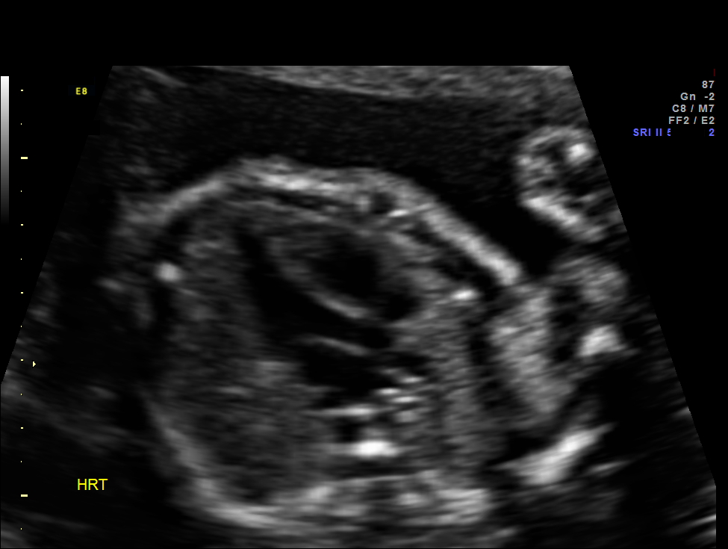
[im 49/78]
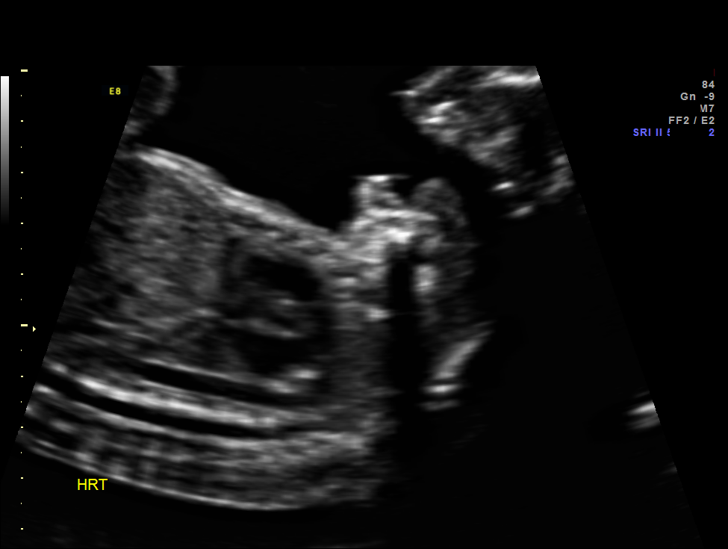
[im 55/78]
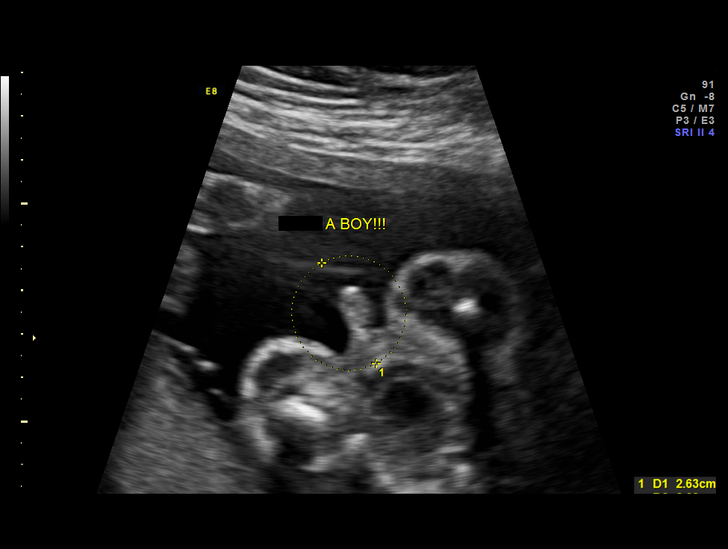
[im 63/78]
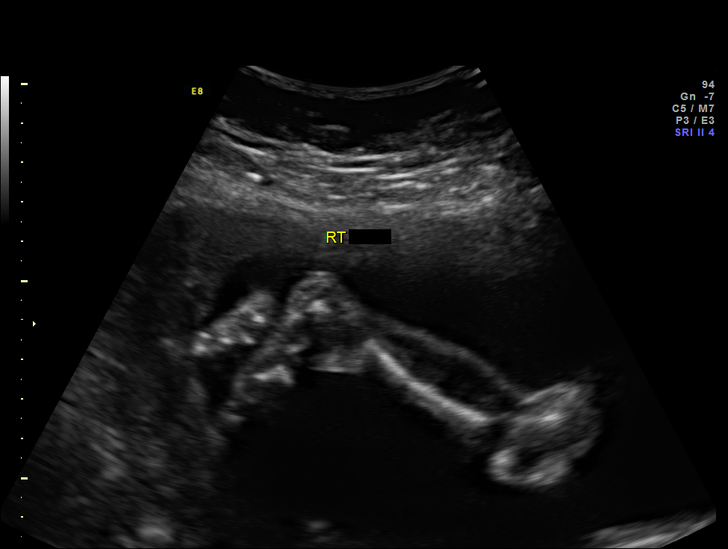
[im 69/78]
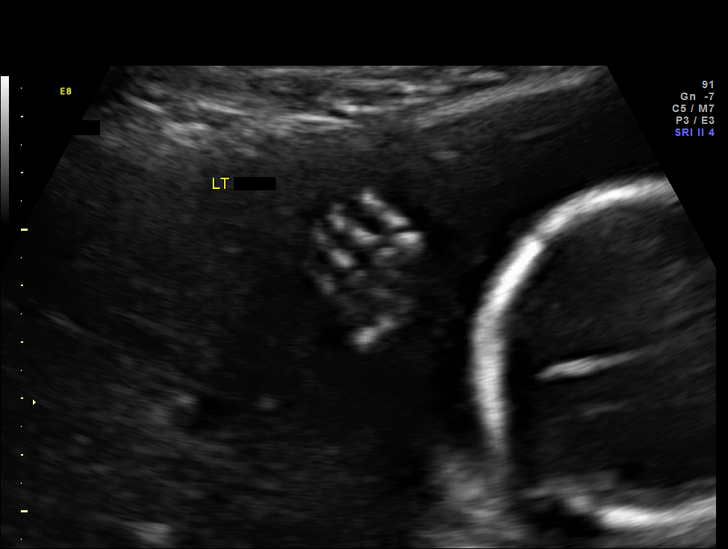
[im 75/78]
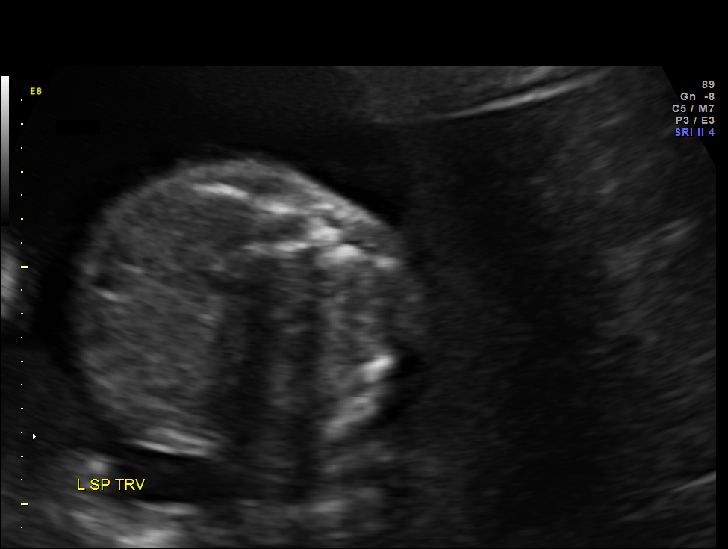

[12 of 28 positions shown; findings below may reference images not displayed]

OBSTETRICS REPORT
                      (Signed Final 02/09/2014 [DATE])

Service(s) Provided

 US OB DETAIL + 14 WK                                  76811.0
Indications

 Detailed fetal anatomic survey                        Z36
 Diabetes - Pregestational, 2nd trimester (on
 novolog and lantus)
 Cigarette smoker
 Obesity complicating pregnancy, second trimester
 Uterine fibroid
 24 weeks gestation of pregnancy
Fetal Evaluation

 Num Of Fetuses:    1
 Fetal Heart Rate:  142                          bpm
 Cardiac Activity:  Observed
 Presentation:      Cephalic
 Placenta:          Posterior, above cervical
                    os
 P. Cord            Visualized, central
 Insertion:

 Amniotic Fluid
 AFI FV:      Subjectively within normal limits
                                             Larg Pckt:     6.7  cm
Biometry

 BPD:     62.5  mm     G. Age:  25w 2d                CI:         80.7   70 - 86
 OFD:     77.4  mm                                    FL/HC:      19.3   18.7 -

 HC:     223.8  mm     G. Age:  24w 3d       48  %    HC/AC:      1.02   1.05 -

 AC:       220  mm     G. Age:  26w 3d       96  %    FL/BPD:     69.0   71 - 87
 FL:      43.1  mm     G. Age:  24w 1d       41  %    FL/AC:      19.6   20 - 24
 HUM:     41.5  mm     G. Age:  25w 1d       67  %
 CER:       26  mm     G. Age:  24w 0d       50  %

 Est. FW:     801  gm    1 lb 12 oz      76  %
Gestational Age

 LMP:           25w 6d        Date:  08/12/13                 EDD:   05/19/14
 U/S Today:     25w 1d                                        EDD:   05/24/14
 Best:          24w 0d     Det. By:  Early Ultrasound         EDD:   06/01/14
                                     (10/14/13)
Anatomy

 Cranium:          Appears normal         Aortic Arch:      Appears normal
 Fetal Cavum:      Appears normal         Ductal Arch:      Appears normal
 Ventricles:       Appears normal         Diaphragm:        Appears normal
 Choroid Plexus:   Appears normal         Stomach:          Appears normal, left
                                                            sided
 Cerebellum:       Appears normal         Abdomen:          Appears normal
 Posterior Fossa:  Appears normal         Abdominal Wall:   Appears nml (cord
                                                            insert, abd wall)
 Nuchal Fold:      Not applicable (>20    Cord Vessels:     Appears normal (3
                   wks GA)                                  vessel cord)
 Face:             Appears normal         Kidneys:          Appear normal
                   (orbits and profile)
 Lips:             Appears normal         Bladder:          Appears normal
 Heart:            Appears normal         Spine:            Appears normal
                   (4CH, axis, and
                   situs)
 RVOT:             Appears normal         Lower             Appears normal
                                          Extremities:
 LVOT:             Appears normal         Upper             Appears normal
                                          Extremities:

 Other:  Fetus appears to be a male. Heels and 5th digit visualized. Nasal
         bone visualized.
Targeted Anatomy

 Fetal Central Nervous System
 Cisterna Magna:
Cervix Uterus Adnexa

 Cervical Length:    3.1      cm

 Cervix:       Normal appearance by transabdominal scan.

 Adnexa:     No abnormality visualized.
Impression

 Single IUP at 24w 0d
 Type 2 diabetes
 Normal fetal anatomic survey.
 The estimated fetal weight today is at the 76th %tile.  The AC
 measures at the 96th %tile.
 Posterior placenta without previa
 Normal amniotic fluid volume
Recommendations

 Fetal echo due to pregestational diabetes.
 Recommend follow-up ultrasound examination in 4 weeks for
 growth; will need serial growth scans thereafter every 4
 weeks.
 Antepartum fetal testing (2x weekly NSTs with weekly AFIs)
 beginning at 32 weeks gestation.
 Delivery by 39 weeks in the absence of other pregnancy
 complications due to type 2 diabetes.
 questions or concerns.

## 2015-10-16 ENCOUNTER — Encounter (HOSPITAL_COMMUNITY): Payer: Self-pay | Admitting: Emergency Medicine

## 2015-10-16 ENCOUNTER — Emergency Department (HOSPITAL_COMMUNITY)
Admission: EM | Admit: 2015-10-16 | Discharge: 2015-10-16 | Disposition: A | Discharge status: Home / Self Care | Payer: Managed Care, Other (non HMO) | Attending: Emergency Medicine | Admitting: Emergency Medicine

## 2015-10-16 DIAGNOSIS — K0889 Other specified disorders of teeth and supporting structures: Secondary | ICD-10-CM

## 2015-10-16 DIAGNOSIS — Z79899 Other long term (current) drug therapy: Secondary | ICD-10-CM | POA: Insufficient documentation

## 2015-10-16 DIAGNOSIS — E119 Type 2 diabetes mellitus without complications: Secondary | ICD-10-CM | POA: Insufficient documentation

## 2015-10-16 DIAGNOSIS — Z792 Long term (current) use of antibiotics: Secondary | ICD-10-CM | POA: Insufficient documentation

## 2015-10-16 DIAGNOSIS — F17210 Nicotine dependence, cigarettes, uncomplicated: Secondary | ICD-10-CM | POA: Insufficient documentation

## 2015-10-16 DIAGNOSIS — Z7984 Long term (current) use of oral hypoglycemic drugs: Secondary | ICD-10-CM | POA: Insufficient documentation

## 2015-10-16 MED ORDER — KETOROLAC TROMETHAMINE 60 MG/2ML IM SOLN
INTRAMUSCULAR | Status: AC
  Filled 2015-10-16: qty 2

## 2015-10-16 MED ORDER — BUPIVACAINE HCL (PF) 0.5 % IJ SOLN
INTRAMUSCULAR | Status: AC
  Filled 2015-10-16: qty 30

## 2015-10-16 MED ORDER — BUPIVACAINE HCL (PF) 0.5 % IJ SOLN
10.0000 mL | Freq: Once | INTRAMUSCULAR | Status: AC
  Administered 2015-10-16: 10 mL

## 2015-10-16 MED ORDER — NAPROXEN 500 MG PO TABS: 500.0000 mg | ORAL_TABLET | Freq: Two times a day (BID) | ORAL | Status: AC

## 2015-10-16 MED ORDER — KETOROLAC TROMETHAMINE 60 MG/2ML IM SOLN
60.0000 mg | Freq: Once | INTRAMUSCULAR | Status: AC
  Administered 2015-10-16: 60 mg via INTRAMUSCULAR

## 2015-10-16 MED ORDER — AMOXICILLIN 500 MG PO CAPS: 500.0000 mg | ORAL_CAPSULE | Freq: Three times a day (TID) | ORAL | Status: AC

## 2015-10-16 NOTE — ED Provider Notes (Signed)
CSN: [id number]     Arrival date & time 10/16/15  [id number] History   First MD Initiated Contact with Patient 10/16/15 [phone]     Chief Complaint  Patient presents with  . Dental Pain    (Consider location/radiation/quality/duration/timing/severity/associated sxs/prior Treatment) HPI   [name] is a 25-y/o female who presents with dental pain since Friday (4 days ago). She has tried ibuprofen and holding cold water in her mouth with minimal pain relief. Pain began in right lower jaw and now radiates into upper jaw with an associated frontal headache. She was not able to sleep last night. She sees dentist Dr. Derenda Mis and plans to try to get an appointment this morning but was too uncomfortable to wait until office opened. Pain is at a 10/10. She says this feels similar to previous dental pain but has lasted much longer. Pain is aching and comes and goes in severity but is pretty constant. Biting down seems to bring on more right-sided swelling. She has been able to eat and drink normally. She is concerned her lower right wisdom tooth may be coming in. She denies fever, chills, nausea, vomiting, or diarrhea. No known sick contacts.   Past Medical History  Diagnosis Date  . Diabetes mellitus   . Hx of chlamydia infection   . Hidradenitis   . Gestational diabetes    Past Surgical History  Procedure Laterality Date  . Tubal ligation     Family History  Problem Relation Age of Onset  . Hypertension Father   . Cancer Maternal Grandfather     lung   Social History  Substance Use Topics  . Smoking status: Current Every Day Smoker -- 1.00 packs/day    Types: Cigarettes  . Smokeless tobacco: Never Used  . Alcohol Use: No   OB History    Gravida Para Term Preterm AB TAB SAB Ectopic Multiple Living   2 1 1       1      Review of Systems  Constitutional: Negative for fever and chills.  Eyes: Negative for photophobia and pain.  Respiratory: Negative for cough.   Cardiovascular: Negative for  chest pain.  Gastrointestinal: Negative for nausea, vomiting, abdominal pain and diarrhea.  Musculoskeletal: Negative for myalgias, neck pain and neck stiffness.  Skin: Negative for rash.  Neurological: Positive for headaches. Negative for facial asymmetry and speech difficulty.    Allergies  Review of patient's allergies indicates no known allergies.  Home Medications   Prior to Admission medications   Medication Sig Start Date End Date Taking? Authorizing Provider  amoxicillin (AMOXIL) 500 MG capsule Take 1 capsule (500 mg total) by mouth 3 (three) times daily. 10/16/15   Harlo Jaso Corinda Gubler, MD  insulin aspart (NOVOLOG) 100 UNIT/ML injection Inject into the skin 3 (three) times daily before meals.    Historical Provider, MD  insulin glargine (LANTUS) 100 UNIT/ML injection Inject into the skin at bedtime.    Historical Provider, MD  metFORMIN (GLUCOPHAGE) 500 MG tablet Take 500 mg by mouth daily.    Historical Provider, MD  naproxen (NAPROSYN) 500 MG tablet Take 1 tablet (500 mg total) by mouth 2 (two) times daily with a meal. 10/16/15   Rogue Bussing, MD  norethindrone-ethinyl estradiol-iron (LOESTRIN FE 1.5/30) 1.5-30 MG-MCG tablet Take 1 tablet by mouth daily.    Historical Provider, MD  Prenatal Vit w/Fe-Methylfol-FA (PNV PO) Take by mouth.    Historical Provider, MD   BP 124/72 mmHg  Pulse 76  Temp(Src) 97.9 F (  36.6 C) (Oral)  Resp 18  Ht 5\' 4"  (1.626 m)  Wt 102.059 kg  BMI 38.60 kg/m2  SpO2 97%  LMP 08/31/2015 Physical Exam  Constitutional: She appears well-developed and well-nourished. She appears distressed.  HENT:  Head: Normocephalic and atraumatic.  Right Ear: External ear normal.  Left Ear: External ear normal.  Nose: Nose normal.  Mouth/Throat: Oropharynx is clear and moist.  Minimal TTP over R lower molars. Back molar partially covered with pink gingival tissue. No maxillary or frontal sinus tenderness to percussion. No obvious caries.  Eyes:  Conjunctivae and EOM are normal. Pupils are equal, round, and reactive to light.  Neck: Normal range of motion. Neck supple.  Cardiovascular: Normal rate, regular rhythm and normal heart sounds.   No murmur heard. Lymphadenopathy:    She has no cervical adenopathy.  Skin: Skin is warm and dry. No rash noted. No erythema.  Nursing note and vitals reviewed.   ED Course  .Nerve Block Date/Time: 10/16/2015 8:03 AM Performed by: Rogue Bussing Authorized by: Elnora Morrison Consent: Verbal consent obtained. Risks and benefits: risks, benefits and alternatives were discussed Consent given by: patient Patient understanding: patient states understanding of the procedure being performed Patient identity confirmed: verbally with patient Time out: Immediately prior to procedure a "time out" was called to verify the correct patient, procedure, equipment, support staff and site/side marked as required. Indications: pain relief Body area: face/mouth Nerve: inferior alveolar Laterality: right Patient sedated: no Needle gauge: 25 G Location technique: anatomical landmarks Local anesthetic: bupivacaine 0.5% without epinephrine Anesthetic total: 3 ml   (including critical care time) Labs Review Labs Reviewed - No data to display  Imaging Review No results found. I have personally reviewed and evaluated these images and lab results as part of my medical decision-making.   EKG Interpretation None      MDM   Final diagnoses:  Pain, dental   Pt presents with R sided dental pain. Performed right inferior alveolar block and gave IM toradol for discomfort. Pt planned to go directly to dental office. No obvious cavity or abscess. Prescribed naproxen. Provided amoxicillin in case of abscess, though patient has no fever. Advised patient not to fill unless unable to be seen by dentist.  Olene Floss, MD [place], PGY-2    [facility],  MD 10/16/15 2120  Elnora Morrison, MD 10/17/15 1544

## 2015-10-16 NOTE — ED Notes (Signed)
Pt ambulating independently w/ steady gait on d/c in no acute distress, A&Ox4. D/c instructions reviewed w/ pt and family - pt and family deny any further questions or concerns at present. Rx given x2  

## 2015-10-16 NOTE — Discharge Instructions (Signed)
Ms. Drucker,  Please see your dentist as soon as possible. I have written for an antibiotic in case of tooth abscess and naproxen for inflammation if you are not able to see your dentist in a timely fashion.  Dental Pain Dental pain may be caused by many things, including:  Tooth decay (cavities or caries). Cavities expose the nerve of your tooth to air and hot or cold temperatures. This can cause pain or discomfort.  Abscess or infection. A dental abscess is a collection of infected pus from a bacterial infection in the inner part of the tooth (pulp). It usually occurs at the end of the tooth's root.  Injury.  An unknown reason (idiopathic). Your pain may be mild or severe. It may only occur when:  You are chewing.  You are exposed to hot or cold temperature.  You are eating or drinking sugary foods or beverages, such as soda or candy. Your pain may also be constant. HOME CARE INSTRUCTIONS Watch your dental pain for any changes. The following actions may help to lessen any discomfort that you are feeling:  Take medicines only as directed by your dentist.  If you were prescribed an antibiotic medicine, finish all of it even if you start to feel better.  Keep all follow-up visits as directed by your dentist. This is important.  Do not apply heat to the outside of your face.  Rinse your mouth or gargle with salt water if directed by your dentist. This helps with pain and swelling.  You can make salt water by adding  tsp of salt to 1 cup of warm water.  Apply ice to the painful area of your face:  Put ice in a plastic bag.  Place a towel between your skin and the bag.  Leave the ice on for 20 minutes, 2-3 times per day.  Avoid foods or drinks that cause you pain, such as:  Very hot or very cold foods or drinks.  Sweet or sugary foods or drinks. SEEK MEDICAL CARE IF:  Your pain is not controlled with medicines.  Your symptoms are worse.  You have new symptoms. SEEK  IMMEDIATE MEDICAL CARE IF:  You are unable to open your mouth.  You are having trouble breathing or swallowing.  You have a fever.  Your face, neck, or jaw is swollen.   This information is not intended to replace advice given to you by your health care provider. Make sure you discuss any questions you have with your health care provider.   Document Released: 03/18/2005 Document Revised: 08/02/2014 Document Reviewed: 03/14/2014 Elsevier Interactive Patient Education [employer].

## 2015-10-16 NOTE — ED Notes (Signed)
Patient complaining of lower right dental pain x 4-5 days.

## 2018-01-02 DIAGNOSIS — F419 Anxiety disorder, unspecified: Secondary | ICD-10-CM | POA: Diagnosis not present

## 2018-01-02 DIAGNOSIS — Z6835 Body mass index (BMI) 35.0-35.9, adult: Secondary | ICD-10-CM | POA: Diagnosis not present

## 2018-03-18 DIAGNOSIS — Z6836 Body mass index (BMI) 36.0-36.9, adult: Secondary | ICD-10-CM | POA: Diagnosis not present

## 2018-03-18 DIAGNOSIS — Z01419 Encounter for gynecological examination (general) (routine) without abnormal findings: Secondary | ICD-10-CM | POA: Diagnosis not present

## 2018-03-18 DIAGNOSIS — Z114 Encounter for screening for human immunodeficiency virus [HIV]: Secondary | ICD-10-CM | POA: Diagnosis not present

## 2018-03-18 DIAGNOSIS — Z113 Encounter for screening for infections with a predominantly sexual mode of transmission: Secondary | ICD-10-CM | POA: Diagnosis not present

## 2018-05-19 DIAGNOSIS — F419 Anxiety disorder, unspecified: Secondary | ICD-10-CM | POA: Diagnosis not present

## 2018-05-19 DIAGNOSIS — Z6835 Body mass index (BMI) 35.0-35.9, adult: Secondary | ICD-10-CM | POA: Diagnosis not present

## 2021-05-28 DIAGNOSIS — R69 Illness, unspecified: Secondary | ICD-10-CM | POA: Diagnosis not present

## 2021-05-28 DIAGNOSIS — E1165 Type 2 diabetes mellitus with hyperglycemia: Secondary | ICD-10-CM | POA: Diagnosis not present

## 2021-05-28 DIAGNOSIS — R5381 Other malaise: Secondary | ICD-10-CM | POA: Diagnosis not present

## 2021-06-04 DIAGNOSIS — Z6835 Body mass index (BMI) 35.0-35.9, adult: Secondary | ICD-10-CM | POA: Diagnosis not present

## 2021-06-04 DIAGNOSIS — R69 Illness, unspecified: Secondary | ICD-10-CM | POA: Diagnosis not present

## 2021-06-04 DIAGNOSIS — D229 Melanocytic nevi, unspecified: Secondary | ICD-10-CM | POA: Diagnosis not present

## 2021-06-04 DIAGNOSIS — E1165 Type 2 diabetes mellitus with hyperglycemia: Secondary | ICD-10-CM | POA: Diagnosis not present

## 2021-07-16 DIAGNOSIS — Z6835 Body mass index (BMI) 35.0-35.9, adult: Secondary | ICD-10-CM | POA: Diagnosis not present

## 2021-07-16 DIAGNOSIS — E1165 Type 2 diabetes mellitus with hyperglycemia: Secondary | ICD-10-CM | POA: Diagnosis not present

## 2021-07-16 DIAGNOSIS — D229 Melanocytic nevi, unspecified: Secondary | ICD-10-CM | POA: Diagnosis not present

## 2021-07-16 DIAGNOSIS — I10 Essential (primary) hypertension: Secondary | ICD-10-CM | POA: Diagnosis not present

## 2021-07-16 DIAGNOSIS — R69 Illness, unspecified: Secondary | ICD-10-CM | POA: Diagnosis not present

## 2021-08-13 DIAGNOSIS — R5381 Other malaise: Secondary | ICD-10-CM | POA: Diagnosis not present

## 2021-08-13 DIAGNOSIS — E1165 Type 2 diabetes mellitus with hyperglycemia: Secondary | ICD-10-CM | POA: Diagnosis not present

## 2021-08-20 DIAGNOSIS — Z6835 Body mass index (BMI) 35.0-35.9, adult: Secondary | ICD-10-CM | POA: Diagnosis not present

## 2021-08-20 DIAGNOSIS — E1165 Type 2 diabetes mellitus with hyperglycemia: Secondary | ICD-10-CM | POA: Diagnosis not present

## 2021-08-20 DIAGNOSIS — R69 Illness, unspecified: Secondary | ICD-10-CM | POA: Diagnosis not present

## 2021-08-20 DIAGNOSIS — L732 Hidradenitis suppurativa: Secondary | ICD-10-CM | POA: Diagnosis not present

## 2021-08-20 DIAGNOSIS — I10 Essential (primary) hypertension: Secondary | ICD-10-CM | POA: Diagnosis not present

## 2021-08-20 DIAGNOSIS — D229 Melanocytic nevi, unspecified: Secondary | ICD-10-CM | POA: Diagnosis not present

## 2022-03-11 ENCOUNTER — Ambulatory Visit: Payer: Self-pay | Admitting: Dermatology
